# Patient Record
Sex: Female | Born: 1970 | Race: White | Hispanic: No | Marital: Married | Smoking: Never smoker
Health system: Southern US, Community
[De-identification: ages and names within clinical notes are randomized; demographics above are authoritative.]

## PROBLEM LIST (undated history)

## (undated) DIAGNOSIS — Z9889 Other specified postprocedural states: Secondary | ICD-10-CM

## (undated) DIAGNOSIS — R112 Nausea with vomiting, unspecified: Secondary | ICD-10-CM

## (undated) DIAGNOSIS — N63 Unspecified lump in unspecified breast: Secondary | ICD-10-CM

## (undated) DIAGNOSIS — N189 Chronic kidney disease, unspecified: Secondary | ICD-10-CM

## (undated) DIAGNOSIS — C801 Malignant (primary) neoplasm, unspecified: Secondary | ICD-10-CM

## (undated) DIAGNOSIS — F419 Anxiety disorder, unspecified: Secondary | ICD-10-CM

## (undated) DIAGNOSIS — I10 Essential (primary) hypertension: Secondary | ICD-10-CM

## (undated) HISTORY — PX: OTHER SURGICAL HISTORY: SHX169

## (undated) HISTORY — PX: BREAST CYST ASPIRATION: SHX578

## (undated) HISTORY — PX: PLACEMENT OF BREAST IMPLANTS: SHX6334

---

## 1998-07-08 HISTORY — PX: AUGMENTATION MAMMAPLASTY: SUR837

## 2009-09-13 ENCOUNTER — Ambulatory Visit: Payer: Self-pay

## 2011-03-06 ENCOUNTER — Ambulatory Visit: Payer: Self-pay

## 2011-03-08 ENCOUNTER — Ambulatory Visit: Payer: Self-pay

## 2011-03-21 ENCOUNTER — Ambulatory Visit: Payer: Self-pay | Admitting: Internal Medicine

## 2011-04-26 ENCOUNTER — Ambulatory Visit: Payer: Self-pay | Admitting: Urology

## 2014-03-21 ENCOUNTER — Ambulatory Visit: Payer: Self-pay | Admitting: Physician Assistant

## 2014-10-05 ENCOUNTER — Ambulatory Visit
Admit: 2014-10-05 | Disposition: A | Discharge status: Home / Self Care | Payer: Self-pay | Attending: Podiatry | Admitting: Podiatry

## 2014-10-27 ENCOUNTER — Ambulatory Visit
Admit: 2014-10-27 | Disposition: A | Discharge status: Home / Self Care | Payer: Self-pay | Attending: Physician Assistant | Admitting: Physician Assistant

## 2014-10-31 LAB — SURGICAL PATHOLOGY

## 2014-11-03 ENCOUNTER — Ambulatory Visit
Admit: 2014-11-03 | Disposition: A | Discharge status: Home / Self Care | Payer: Self-pay | Attending: Physician Assistant | Admitting: Physician Assistant

## 2014-11-03 ENCOUNTER — Ambulatory Visit
Admit: 2014-11-03 | Disposition: A | Discharge status: Home / Self Care | Payer: Self-pay | Attending: Ophthalmology | Admitting: Ophthalmology

## 2014-11-07 ENCOUNTER — Inpatient Hospital Stay: Admission: RE | Admit: 2014-11-07 | Payer: Self-pay | Source: Ambulatory Visit

## 2014-11-07 ENCOUNTER — Encounter: Payer: Self-pay | Admitting: *Deleted

## 2014-11-07 DIAGNOSIS — Z882 Allergy status to sulfonamides status: Secondary | ICD-10-CM | POA: Diagnosis not present

## 2014-11-07 DIAGNOSIS — Z978 Presence of other specified devices: Secondary | ICD-10-CM | POA: Diagnosis not present

## 2014-11-07 DIAGNOSIS — Z87898 Personal history of other specified conditions: Secondary | ICD-10-CM | POA: Diagnosis not present

## 2014-11-07 DIAGNOSIS — Z85828 Personal history of other malignant neoplasm of skin: Secondary | ICD-10-CM | POA: Diagnosis not present

## 2014-11-07 DIAGNOSIS — F419 Anxiety disorder, unspecified: Secondary | ICD-10-CM | POA: Diagnosis not present

## 2014-11-07 DIAGNOSIS — I10 Essential (primary) hypertension: Secondary | ICD-10-CM | POA: Diagnosis not present

## 2014-11-07 DIAGNOSIS — Z87442 Personal history of urinary calculi: Secondary | ICD-10-CM | POA: Diagnosis not present

## 2014-11-07 DIAGNOSIS — H2512 Age-related nuclear cataract, left eye: Secondary | ICD-10-CM | POA: Diagnosis not present

## 2014-11-13 NOTE — H&P (Signed)
  History and physical was faxed and scanned in.   

## 2014-11-14 ENCOUNTER — Encounter: Payer: Self-pay | Admitting: Anesthesiology

## 2014-11-14 ENCOUNTER — Encounter
Admission: RE | Disposition: A | Discharge status: Home / Self Care | Payer: Self-pay | Source: Ambulatory Visit | Attending: Ophthalmology

## 2014-11-14 ENCOUNTER — Ambulatory Visit: Payer: BLUE CROSS/BLUE SHIELD | Admitting: Anesthesiology

## 2014-11-14 ENCOUNTER — Ambulatory Visit
Admission: RE | Admit: 2014-11-14 | Discharge: 2014-11-14 | Disposition: A | Discharge status: Home / Self Care | Payer: BLUE CROSS/BLUE SHIELD | Source: Ambulatory Visit | Attending: Ophthalmology | Admitting: Ophthalmology

## 2014-11-14 DIAGNOSIS — Z87442 Personal history of urinary calculi: Secondary | ICD-10-CM | POA: Insufficient documentation

## 2014-11-14 DIAGNOSIS — Z978 Presence of other specified devices: Secondary | ICD-10-CM | POA: Insufficient documentation

## 2014-11-14 DIAGNOSIS — H2512 Age-related nuclear cataract, left eye: Secondary | ICD-10-CM | POA: Diagnosis not present

## 2014-11-14 DIAGNOSIS — Z882 Allergy status to sulfonamides status: Secondary | ICD-10-CM | POA: Insufficient documentation

## 2014-11-14 DIAGNOSIS — I10 Essential (primary) hypertension: Secondary | ICD-10-CM | POA: Insufficient documentation

## 2014-11-14 DIAGNOSIS — F419 Anxiety disorder, unspecified: Secondary | ICD-10-CM | POA: Insufficient documentation

## 2014-11-14 DIAGNOSIS — Z87898 Personal history of other specified conditions: Secondary | ICD-10-CM | POA: Insufficient documentation

## 2014-11-14 DIAGNOSIS — Z85828 Personal history of other malignant neoplasm of skin: Secondary | ICD-10-CM | POA: Insufficient documentation

## 2014-11-14 HISTORY — DX: Nausea with vomiting, unspecified: R11.2

## 2014-11-14 HISTORY — DX: Anxiety disorder, unspecified: F41.9

## 2014-11-14 HISTORY — DX: Chronic kidney disease, unspecified: N18.9

## 2014-11-14 HISTORY — PX: CATARACT EXTRACTION W/PHACO: SHX586

## 2014-11-14 HISTORY — DX: Malignant (primary) neoplasm, unspecified: C80.1

## 2014-11-14 HISTORY — DX: Nausea with vomiting, unspecified: Z98.890

## 2014-11-14 HISTORY — DX: Essential (primary) hypertension: I10

## 2014-11-14 LAB — POCT PREGNANCY, URINE: Preg Test, Ur: NEGATIVE

## 2014-11-14 SURGERY — PHACOEMULSIFICATION, CATARACT, WITH IOL INSERTION
Anesthesia: Monitor Anesthesia Care | Site: Eye | Laterality: Left | Wound class: Clean

## 2014-11-14 MED ORDER — ALFENTANIL 500 MCG/ML IJ INJ
INJECTION | INTRAMUSCULAR | Status: DC | PRN
  Administered 2014-11-14: 500 ug via INTRAVENOUS

## 2014-11-14 MED ORDER — FENTANYL CITRATE (PF) 100 MCG/2ML IJ SOLN
INTRAMUSCULAR | Status: DC | PRN
  Administered 2014-11-14: 100 ug via INTRAVENOUS

## 2014-11-14 MED ORDER — MOXIFLOXACIN HCL 0.5 % OP SOLN - NO CHARGE
OPHTHALMIC | Status: DC | PRN
  Administered 2014-11-14: 1 [drp] via OPHTHALMIC

## 2014-11-14 MED ORDER — LIDOCAINE HCL (PF) 4 % IJ SOLN
INTRAMUSCULAR | Status: AC
  Filled 2014-11-14: qty 5

## 2014-11-14 MED ORDER — NA CHONDROIT SULF-NA HYALURON 40-17 MG/ML IO SOLN
INTRAOCULAR | Status: AC
  Filled 2014-11-14: qty 1

## 2014-11-14 MED ORDER — TETRACAINE HCL 0.5 % OP SOLN
OPHTHALMIC | Status: AC
  Filled 2014-11-14: qty 2

## 2014-11-14 MED ORDER — MOXIFLOXACIN HCL 0.5 % OP SOLN
1.0000 [drp] | OPHTHALMIC | Status: AC
  Administered 2014-11-14 (×3): 1 [drp] via OPHTHALMIC

## 2014-11-14 MED ORDER — PROPARACAINE HCL 0.5 % OP SOLN
OPHTHALMIC | Status: AC
  Filled 2014-11-14: qty 15

## 2014-11-14 MED ORDER — TETRACAINE HCL 0.5 % OP SOLN
OPHTHALMIC | Status: DC | PRN
  Administered 2014-11-14: 1 [drp] via OPHTHALMIC

## 2014-11-14 MED ORDER — SODIUM CHLORIDE 0.9 % IV SOLN
INTRAVENOUS | Status: DC
  Administered 2014-11-14 (×2): via INTRAVENOUS

## 2014-11-14 MED ORDER — CEFUROXIME OPHTHALMIC INJECTION 1 MG/0.1 ML
INJECTION | OPHTHALMIC | Status: AC
  Filled 2014-11-14: qty 0.1

## 2014-11-14 MED ORDER — EPINEPHRINE HCL 1 MG/ML IJ SOLN
INTRAMUSCULAR | Status: AC
  Filled 2014-11-14: qty 1

## 2014-11-14 MED ORDER — HYALURONIDASE HUMAN 150 UNIT/ML IJ SOLN
INTRAMUSCULAR | Status: AC
  Filled 2014-11-14: qty 1

## 2014-11-14 MED ORDER — CYCLOPENTOLATE HCL 2 % OP SOLN
1.0000 [drp] | OPHTHALMIC | Status: AC
  Administered 2014-11-14 (×4): 1 [drp] via OPHTHALMIC

## 2014-11-14 MED ORDER — NA CHONDROIT SULF-NA HYALURON 40-17 MG/ML IO SOLN
INTRAOCULAR | Status: DC | PRN
  Administered 2014-11-14: 1 mL via INTRAOCULAR

## 2014-11-14 MED ORDER — MIDAZOLAM HCL 2 MG/2ML IJ SOLN
INTRAMUSCULAR | Status: DC | PRN
  Administered 2014-11-14: 1.5 mg via INTRAVENOUS
  Administered 2014-11-14: 1 mg via INTRAVENOUS

## 2014-11-14 MED ORDER — EPINEPHRINE HCL 1 MG/ML IJ SOLN
INTRAMUSCULAR | Status: DC | PRN
  Administered 2014-11-14: 1 mL

## 2014-11-14 MED ORDER — CYCLOPENTOLATE HCL 2 % OP SOLN
OPHTHALMIC | Status: AC
  Filled 2014-11-14: qty 2

## 2014-11-14 MED ORDER — CARBACHOL 0.01 % IO SOLN
INTRAOCULAR | Status: DC | PRN
  Administered 2014-11-14: 0.5 mL via INTRAOCULAR

## 2014-11-14 MED ORDER — PROPARACAINE HCL 0.5 % OP SOLN
OPHTHALMIC | Status: DC | PRN
  Administered 2014-11-14: 1 [drp] via OPHTHALMIC

## 2014-11-14 MED ORDER — BUPIVACAINE HCL (PF) 0.75 % IJ SOLN
INTRAMUSCULAR | Status: AC
  Filled 2014-11-14: qty 10

## 2014-11-14 MED ORDER — MOXIFLOXACIN HCL 0.5 % OP SOLN
OPHTHALMIC | Status: AC
  Filled 2014-11-14: qty 3

## 2014-11-14 MED ORDER — PHENYLEPHRINE HCL 10 % OP SOLN
1.0000 [drp] | OPHTHALMIC | Status: AC
  Administered 2014-11-14 (×4): 1 [drp] via OPHTHALMIC

## 2014-11-14 MED ORDER — PHENYLEPHRINE HCL 10 % OP SOLN
OPHTHALMIC | Status: AC
  Filled 2014-11-14: qty 5

## 2014-11-14 SURGICAL SUPPLY — 27 items
CENTURION VISION SYSTEM ×3 IMPLANT
CORD BIP STRL DISP 12FT (MISCELLANEOUS) ×3 IMPLANT
DRAPE XRAY CASSETTE 23X24 (DRAPES) ×3 IMPLANT
ERASER HMR WETFIELD 18G (MISCELLANEOUS) ×3 IMPLANT
GLOVE BIO SURGEON STRL SZ8 (GLOVE) ×3 IMPLANT
GLOVE SURG LX 6.5 MICRO (GLOVE) ×2
GLOVE SURG LX 8.0 MICRO (GLOVE) ×2
GLOVE SURG LX STRL 6.5 MICRO (GLOVE) ×1 IMPLANT
GLOVE SURG LX STRL 8.0 MICRO (GLOVE) ×1 IMPLANT
GOWN STRL REUS W/ TWL LRG LVL3 (GOWN DISPOSABLE) ×1 IMPLANT
GOWN STRL REUS W/ TWL XL LVL3 (GOWN DISPOSABLE) ×1 IMPLANT
GOWN STRL REUS W/TWL LRG LVL3 (GOWN DISPOSABLE) ×2
GOWN STRL REUS W/TWL XL LVL3 (GOWN DISPOSABLE) ×2
LENS IOL TECNIS TRC I 300 22.0 (Intraocular Lens) ×1 IMPLANT
LENS IOL TORIC 22.0 (Intraocular Lens) ×2 IMPLANT
LENS IOL TORIC 300 22.0 (Intraocular Lens) ×1 IMPLANT
PACK CATARACT (MISCELLANEOUS) ×3 IMPLANT
PACK CATARACT DINGLEDEIN LX (MISCELLANEOUS) ×3 IMPLANT
PACK EYE AFTER SURG (MISCELLANEOUS) ×3 IMPLANT
SHLD EYE VISITEC  UNIV (MISCELLANEOUS) ×3 IMPLANT
SOL PREP PVP 2OZ (MISCELLANEOUS) ×3
SOLUTION PREP PVP 2OZ (MISCELLANEOUS) ×1 IMPLANT
SUT SILK 5-0 (SUTURE) ×3 IMPLANT
SYR 5ML LL (SYRINGE) ×3 IMPLANT
SYR TB 1ML 27GX1/2 LL (SYRINGE) ×3 IMPLANT
WATER STERILE IRR 1000ML POUR (IV SOLUTION) ×3 IMPLANT
WIPE NON LINTING 3.25X3.25 (MISCELLANEOUS) ×3 IMPLANT

## 2014-11-14 NOTE — Discharge Instructions (Addendum)
Eye Surgery Discharge Instructions  Expect mild scratchy sensation or mild soreness. DO NOT RUB YOUR EYE!  The day of surgery:  Minimal physical activity, but bed rest is not required  No reading, computer work, or close hand work  No bending, lifting, or straining.  May watch TV  For 24 hours:  No driving, legal decisions, or alcoholic beverages  Safety precautions  Eat anything you prefer: It is better to start with liquids, then soup then solid foods.  _____ Eye patch should be worn until postoperative exam tomorrow.  ____ Solar shield eyeglasses should be worn for comfort in the sunlight/patch while sleeping  Resume all regular medications including aspirin or Coumadin if these were discontinued prior to surgery. You may shower, bathe, shave, or wash your hair. Tylenol may be taken for mild discomfort.  Call your doctor if you experience significant pain, nausea, or vomiting, fever > 101 or other signs of infection. [phone] or [phone] Specific instructions:  Follow-up Information    Follow up with DINGELDEIN,STEVEN, MD In 1 day.   Specialty:  Ophthalmology   Contact information:   [address]   [place] [address] [phone]         See handout  Appointment 11/15/14  At 10:10Cataract Surgery  A cataract is a clouding of the lens of the eye. When a lens becomes cloudy, vision is reduced based on the degree and nature of the clouding. Surgery may be needed to improve vision. Surgery removes the cloudy lens and usually replaces it with a substitute lens (intraocular lens, IOL). LET YOUR EYE DOCTOR KNOW ABOUT:  Allergies to food or medicine.  Medicines taken including herbs, eye drops, over-the-counter medicines, and creams.  Use of steroids (by mouth or creams).  Previous problems with anesthetics or numbing medicine.  History of bleeding problems or blood clots.  Previous surgery.  Other health problems, including diabetes and  kidney problems.  Possibility of pregnancy, if this applies. RISKS AND COMPLICATIONS  Infection.  Inflammation of the eyeball (endophthalmitis) that can spread to both eyes (sympathetic ophthalmia).  Poor wound healing.  If an IOL is inserted, it can later fall out of proper position. This is very uncommon.  Clouding of the part of your eye that holds an IOL in place. This is called an "after-cataract." These are uncommon but easily treated. BEFORE THE PROCEDURE  Do not eat or drink anything except small amounts of water for 8 to 12 before your surgery, or as directed by your caregiver.  Unless you are told otherwise, continue any eye drops you have been prescribed.  Talk to your primary caregiver about all other medicines that you take (both prescription and nonprescription). In some cases, you may need to stop or change medicines near the time of your surgery. This is most important if you are taking blood-thinning medicine.Do not stop medicines unless you are told to do so.  Arrange for someone to drive you to and from the procedure.  Do not put contact lenses in either eye on the day of your surgery. PROCEDURE There is more than one method for safely removing a cataract. Your doctor can explain the differences and help determine which is best for you. Phacoemulsification surgery is the most common form of cataract surgery.  An injection is given behind the eye or eye drops are given to make this a painless procedure.  A small cut (incision) is made on the edge of the clear, dome-shaped surface that covers the front of  the eye (cornea).  A tiny probe is painlessly inserted into the eye. This device gives off ultrasound waves that soften and break up the cloudy center of the lens. This makes it easier for the cloudy lens to be removed by suction.  An IOL may be implanted.  The normal lens of the eye is covered by a clear capsule. Part of that capsule is intentionally left in  the eye to support the IOL.  Your surgeon may or may not use stitches to close the incision. There are other forms of cataract surgery that require a larger incision and stitches to close the eye. This approach is taken in cases where the doctor feels that the cataract cannot be easily removed using phacoemulsification. AFTER THE PROCEDURE  When an IOL is implanted, it does not need care. It becomes a permanent part of your eye and cannot be seen or felt.  Your doctor will schedule follow-up exams to check on your progress.  Review your other medicines with your doctor to see which can be resumed after surgery.  Use eye drops or take medicine as prescribed by your doctor. Document Released: 06/13/2011 Document Revised: 11/08/2013 Document Reviewed: 06/13/2011 [facility] Patient Information 2015 [place], [place]. This information is not intended to replace advice given to you by your health care provider. Make sure you discuss any questions you have with your health care provider.

## 2014-11-14 NOTE — Discharge Summary (Signed)
Patient discharged in the care of a responsible adult. Volunteer wheeled patient to front door and assisted placing her in the car.

## 2014-11-14 NOTE — Interval H&P Note (Signed)
History and Physical Interval Note:  11/14/2014 8:24 AM  Tanya Powell  has presented today for surgery, with the diagnosis of cataract  The various methods of treatment have been discussed with the patient and family. After consideration of risks, benefits and other options for treatment, the patient has consented to  Procedure(s): CATARACT EXTRACTION PHACO AND INTRAOCULAR LENS PLACEMENT ([place]) (Left) as a surgical intervention .  The patient's history has been reviewed, patient examined, no change in status, stable for surgery.  I have reviewed the patient's chart and labs.  Questions were answered to the patient's satisfaction.     [name]

## 2014-11-14 NOTE — Anesthesia Postprocedure Evaluation (Signed)
  Anesthesia Post-op Note  Patient: Tanya Powell  Procedure(s) Performed: Procedure(s) with comments: CATARACT EXTRACTION PHACO AND INTRAOCULAR LENS PLACEMENT ([facility]) (Left) - [place] 00:35 AP% 18.3 CDE 8.72  Anesthesia type:MAC  Patient location: PACU  Post pain: Pain level controlled  Post assessment: Post-op Vital signs reviewed, Patient's Cardiovascular Status Stable, Respiratory Function Stable, Patent Airway and No signs of Nausea or vomiting  Post vital signs: Reviewed and stable  Last Vitals:  Filed Vitals:   11/14/14 0806  BP: 122/83  Pulse: 84  Temp: 36.7 C  Resp: 16    Level of consciousness: awake, alert  and patient cooperative  Complications: No apparent anesthesia complications

## 2014-11-14 NOTE — Interval H&P Note (Signed)
History and Physical Interval Note:  11/14/2014 8:25 AM  Tanya Powell  has presented today for surgery, with the diagnosis of cataract  The various methods of treatment have been discussed with the patient and family. After consideration of risks, benefits and other options for treatment, the patient has consented to  Procedure(s): CATARACT EXTRACTION PHACO AND INTRAOCULAR LENS PLACEMENT ([place]) (Left) as a surgical intervention .  The patient's history has been reviewed, patient examined, no change in status, stable for surgery.  I have reviewed the patient's chart and labs.  Questions were answered to the patient's satisfaction.     Tanya Powell

## 2014-11-14 NOTE — Brief Op Note (Signed)
11/14/2014  10:11 AM  PATIENT:  Tanya Powell  44 y.o. female  PRE-OPERATIVE DIAGNOSIS:  Cataract left eye  POST-OPERATIVE DIAGNOSIS:  Cataract eye  PROCEDURE:  Procedure(s) with comments: CATARACT EXTRACTION PHACO AND INTRAOCULAR LENS PLACEMENT ([facility]) (Left) - [place] 00:35 AP% 18.3 CDE 8.72  SURGEON:  Surgeon(s) and Role:    Remo Lipps Nadia Torr, MD - Primary  PHYSICIAN ASSISTANT:   ASSISTANTS: none   ANESTHESIA: 4% lidocaine with 0.75% Marcaine 50/50 mixture with 10 units per cc of Hylenex given as peribulbar  EBL:     BLOOD ADMINISTERED:none  DRAINS: none   LOCAL MEDICATIONS USED:  MARCAINE    and LIDOCAINE   SPECIMEN:  No Specimen  DISPOSITION OF SPECIMEN:  N/A  COUNTS:  YES  TOURNIQUET:  * No tourniquets in log *  DICTATION: .Note written in EPIC  PLAN OF CARE: Discharge to home after PACU  PATIENT DISPOSITION:  Short Stay   Delay start of Pharmacological VTE agent (>24hrs) due to surgical blood loss or risk of bleeding: not applicable

## 2014-11-14 NOTE — Transfer of Care (Signed)
Immediate Anesthesia Transfer of Care Note  Patient: Tanya Powell  Procedure(s) Performed: Procedure(s) with comments: CATARACT EXTRACTION PHACO AND INTRAOCULAR LENS PLACEMENT ([facility]) (Left) - [place] 00:35 AP% 18.3 CDE 8.72  Patient Location: PACU  Anesthesia Type:MAC  Level of Consciousness: awake, alert  and oriented  Airway & Oxygen Therapy: Patient Spontanous Breathing  Post-op Assessment: Report given to RN and Post -op Vital signs reviewed and stable  Post vital signs: Reviewed and stable  Last Vitals:  Filed Vitals:   11/14/14 0806  BP: 122/83  Pulse: 84  Temp: 36.7 C  Resp: 16    Complications: No apparent anesthesia complications

## 2014-11-14 NOTE — Interval H&P Note (Signed)
History and Physical Interval Note:  11/14/2014 8:25 AM  Tanya Powell  has presented today for surgery, with the diagnosis of cataract  The various methods of treatment have been discussed with the patient and family. After consideration of risks, benefits and other options for treatment, the patient has consented to  Procedure(s): CATARACT EXTRACTION PHACO AND INTRAOCULAR LENS PLACEMENT ([place]) (Left) as a surgical intervention .  The patient's history has been reviewed, patient examined, no change in status, stable for surgery.  I have reviewed the patient's chart and labs.  Questions were answered to the patient's satisfaction.     Misako Roeder

## 2014-11-14 NOTE — Op Note (Signed)
Date of Surgery: 11/14/2014 Date of Dictation: 11/14/2014 Pre-operative Diagnosis: Nuclear Sclerotic Cataract Left Eye Post-operative Diagnosis: same Procedure performed: Extra-capsular Cataract Extraction (ECCE) with placement of a toric posterior chamber intraocular lens (IOL) Left Eye IOL Alcon AcrySof IQ Toric LensT4 22.0 with 2.25D Cyl Anesthesia: 2% Lidocaine and 4% Marcaine in a 50/50 mixture with 10 unites/ml of Hylenex given as a peribulbar Anesthesiologist: Dr. Marcello Moores Complications: none Estimated Blood Loss: less than 1 ml  Description of procedure:  The patient sat upright and the eyes were anesthetized with topical proparacaine. With the patient fixing at a distant target the 3 o'clock and 9 o'clock positions at the limbus were marked with an sterile indelible surgical marking pen.  The patient was given anesthesia and sedation via intravenous access. The patient was then prepped and draped in the usual fashion. A 25-gauge needle was bent to use for starting the capsulorhexis. A 5-0 silk suture was placed through the conjunctiva superior and inferiorly to serve as bridle sutures.   The [place] system was engaged and registration was performed. The positions of the incisions and the steep axis of the astigmatism for lens alignment (3 degrees) were identified by the [place] system and marked with an indelible pen. The Verion heads up display was turned off.  Hemostasis was obtained at the the position of the main incision using an eraser cautery. A partial thickness groove was made at the at that location with a 64 [place] blade and this was dissected anteriorly with an Avaya. The anterior chamber was entered at 10 o'clock with a 1.0 mm paracentesis knife and through the lamellar dissection with a 2.6 mm Alcon keratome. DiscoVisc was injected to replace the aqueous and a continuous tear curvilinear capsulorhexis was performed using a bent 25-gauge needle.  Balance salt on a  syringe was used to perform hydro-dissection and phacoemulsification was carried out using a divide and conquer technique. Total ultrasound time was 00:35. The average ultrasonic power was 18.3. The CDE was 8.72.  Irrigation/aspiration was used to remove the residual cortex and the capsular bag was inflated with DiscoVisc. The intraocular lens was inserted into the capsular bag using a Monarch Delivery System cartridge. The Verion heads up display was turned on and the lens was rotated so that the marks on the base of the haptics were aligned with the steep axis of astigmatism as identified using the [place] unit.   Irrigation/aspiration was used to remove the residual DiscoVisc. The I/A hand piece was pressed down on top of the lens to prevent rotation. The wound was inflated with balanced salt and checked for leaks. None were found. The position of the Toric lens was reconfirmed using the [place] unit. Miostat was injected via the paracentesis track and 0.1 ml of cefuroxime containing 1 mg of drug was injected via the paracentesis track. The wound was checked for leaks again and none were found.   The bridal sutures were removed and two drops of Vigamox were placed on the eye. An eye shield was placed to protect the eye and the patient was discharged to the recovery area in good condition.   Rosalinda Seaman MD @T @

## 2014-11-14 NOTE — Anesthesia Preprocedure Evaluation (Addendum)
Anesthesia Evaluation   Patient awake    Reviewed: Allergy & Precautions, NPO status , Patient's Chart, lab work & pertinent test results, reviewed documented beta blocker date and time   History of Anesthesia Complications (+) PONV  Airway Mallampati: II  TM Distance: >3 FB Neck ROM: Full    Dental  (+) Chipped   Pulmonary          Cardiovascular hypertension,     Neuro/Psych Anxiety    GI/Hepatic   Endo/Other    Renal/GU Renal disease     Musculoskeletal   Abdominal   Peds  Hematology   Anesthesia Other Findings   Reproductive/Obstetrics                            Anesthesia Physical Anesthesia Plan  ASA: III  Anesthesia Plan: MAC   Post-op Pain Management:    Induction:   Airway Management Planned: Nasal Cannula  Additional Equipment:   Intra-op Plan:   Post-operative Plan:   Informed Consent: I have reviewed the patients History and Physical, chart, labs and discussed the procedure including the risks, benefits and alternatives for the proposed anesthesia with the patient or authorized representative who has indicated his/her understanding and acceptance.     Plan Discussed with: CRNA  Anesthesia Plan Comments:         Anesthesia Quick Evaluation

## 2014-11-17 ENCOUNTER — Encounter: Payer: Self-pay | Admitting: Ophthalmology

## 2016-06-25 IMAGING — MG MM DIGITAL SCREENING BILAT W/ CAD
9 series · 9 of 9 positions shown · non-contrast
Comparison: Previous exam(s).

CLINICAL DATA: Screening.

EXAM:
DIGITAL SCREENING BILATERAL MAMMOGRAM WITH CAD with implants.
The patient has bilateral submuscular saline implants.

[R MLO (1 of 3)]
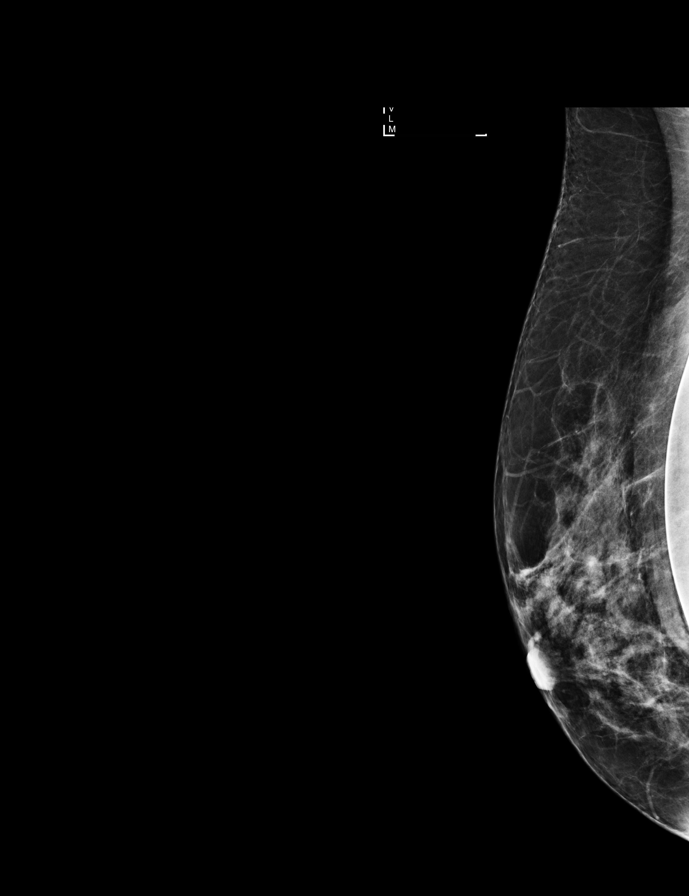

[L CC (1 of 2)]
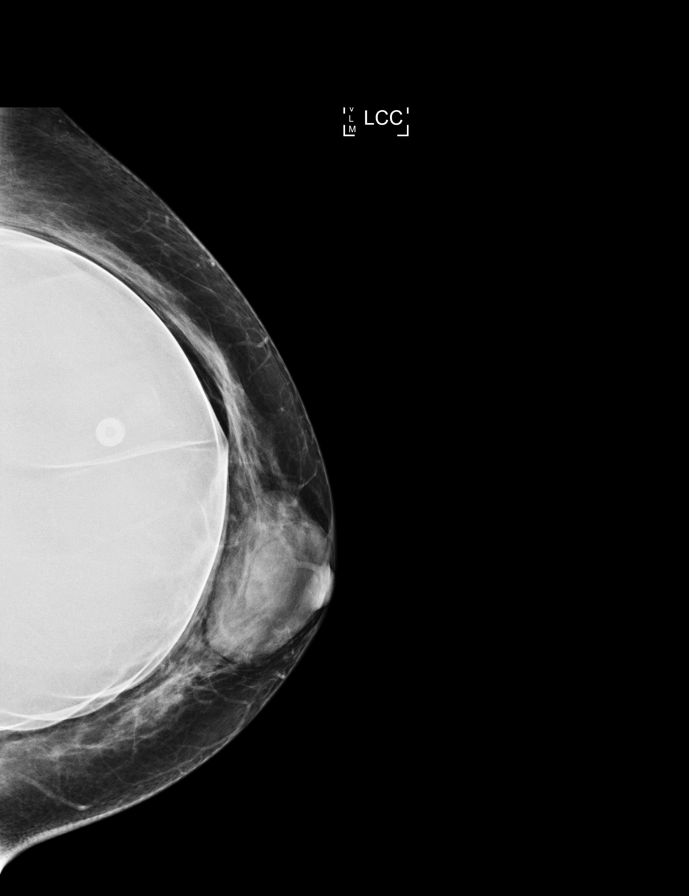

[R MLO (2 of 3)]
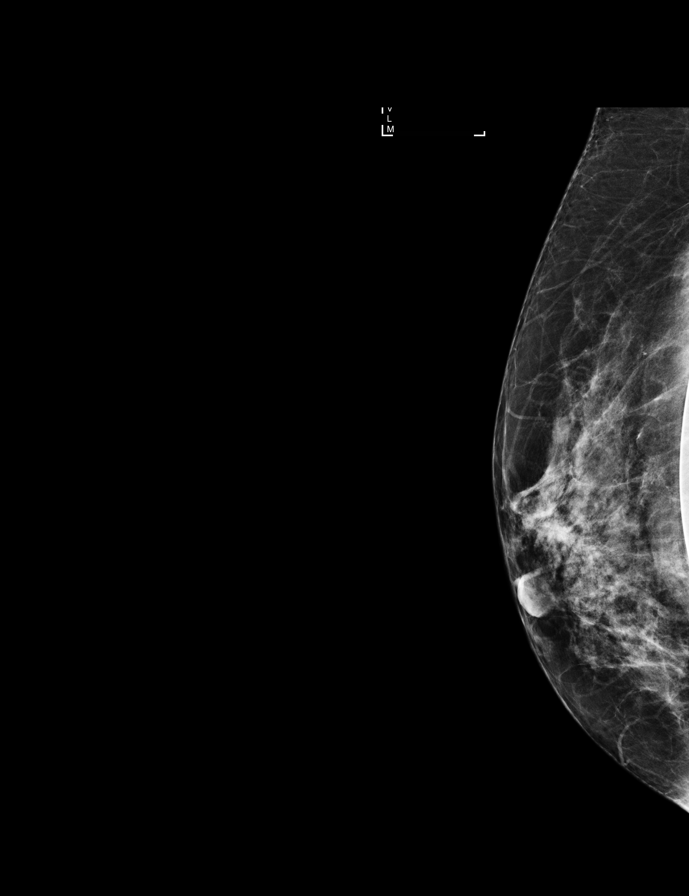

[L CC (2 of 2)]
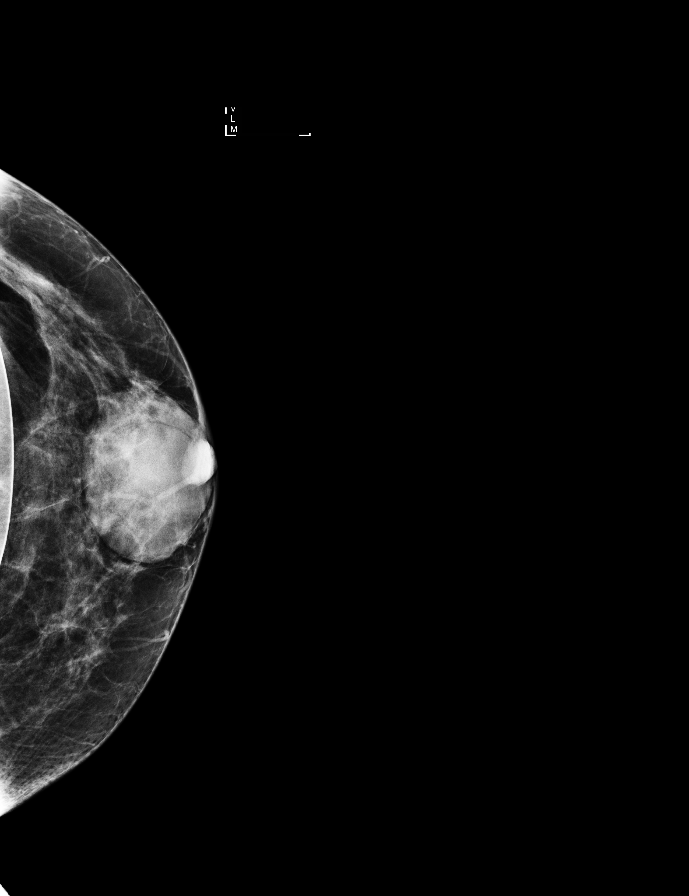

[R CC (1 of 2)]
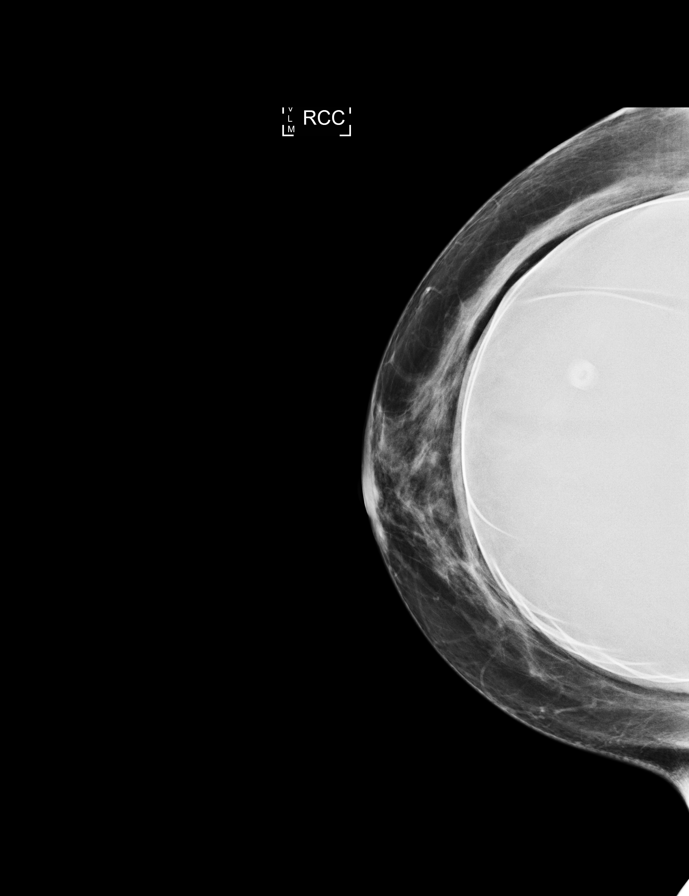

[L MLO (1 of 2)]
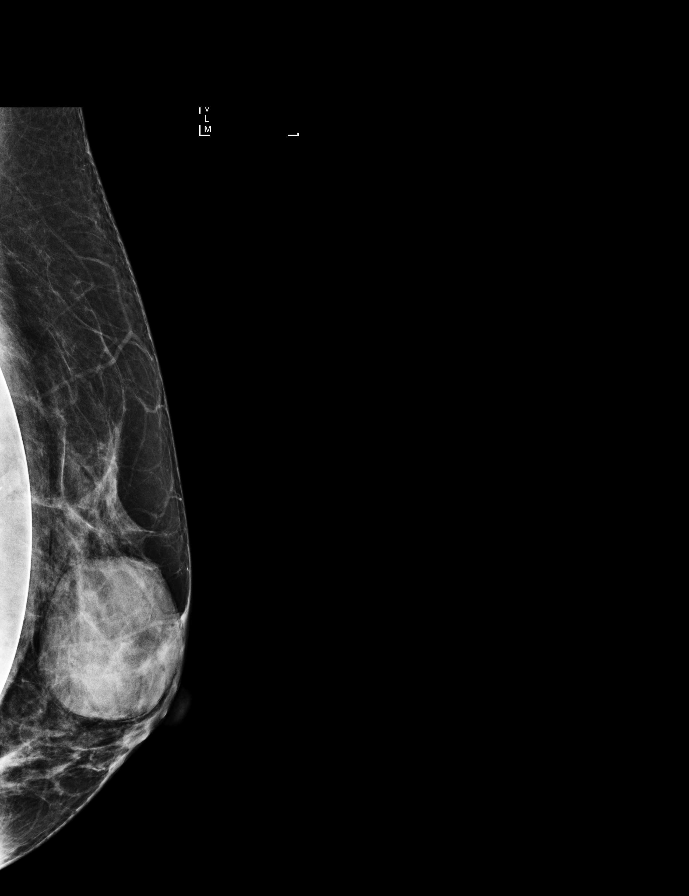

[R MLO (3 of 3)]
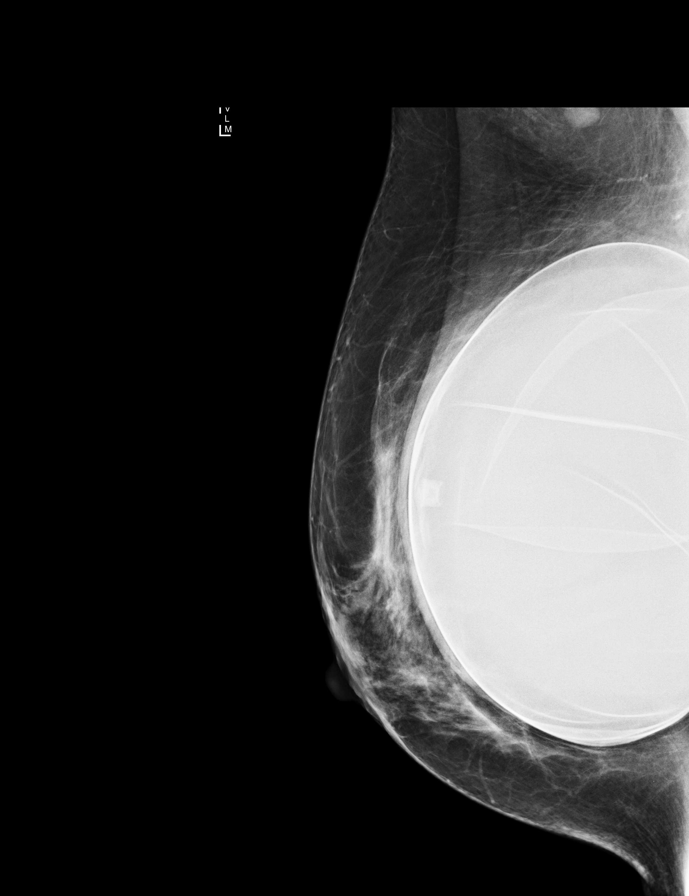

[R CC (2 of 2)]
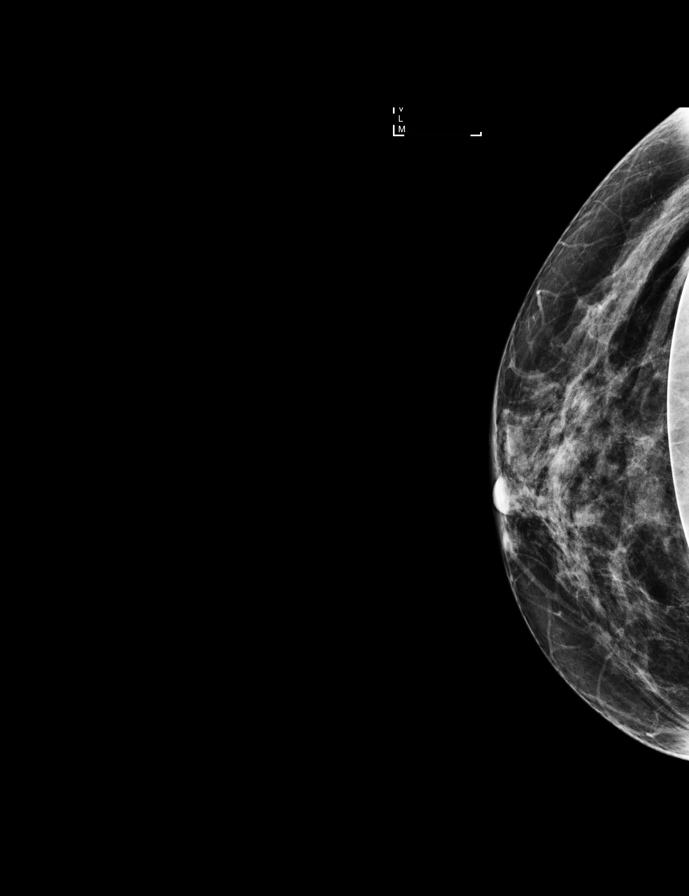

[L MLO (2 of 2)]
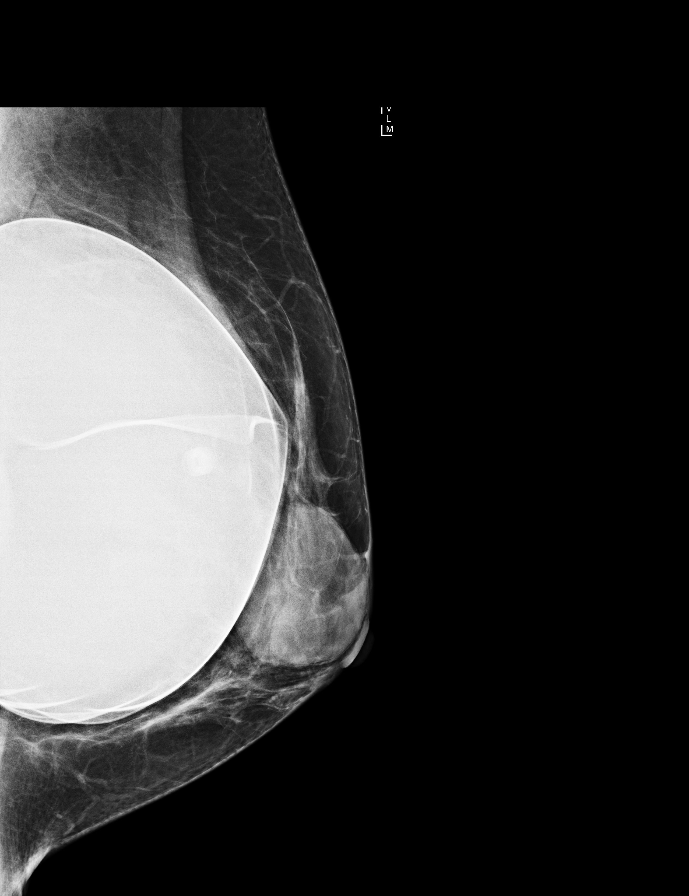

[9 of 9 positions shown; findings below may reference images not displayed]

ACR Breast Density Category d: The breast tissue is extremely dense,
which lowers the sensitivity of mammography.
FINDINGS: In the left breast, a possible mass warrants further evaluation. In
the right breast, no findings suspicious for malignancy. Images were
processed with CAD.
IMPRESSION: Further evaluation is suggested for possible mass in the left
breast.

RECOMMENDATION:
Ultrasound of the left breast. (Code:[id number])

The patient will be contacted regarding the findings, and additional
imaging will be scheduled.

BI-RADS CATEGORY  0: Incomplete. Need additional imaging evaluation
and/or prior mammograms for comparison.

## 2016-07-02 IMAGING — US US BREAST*L* LIMITED INC AXILLA
1 series · 5 of 5 positions shown · non-contrast
Comparison: Previous exam(s).

CLINICAL DATA: Patient presents for ultrasound of a retroareolar
left breast mass noted on the current screening study.

EXAM:
ULTRASOUND OF THE LEFT BREAST

[Series 1: us breast*left* limited inc axilla · 0.08mm/px · 5 of 5 slices shown]
[im 1/5]
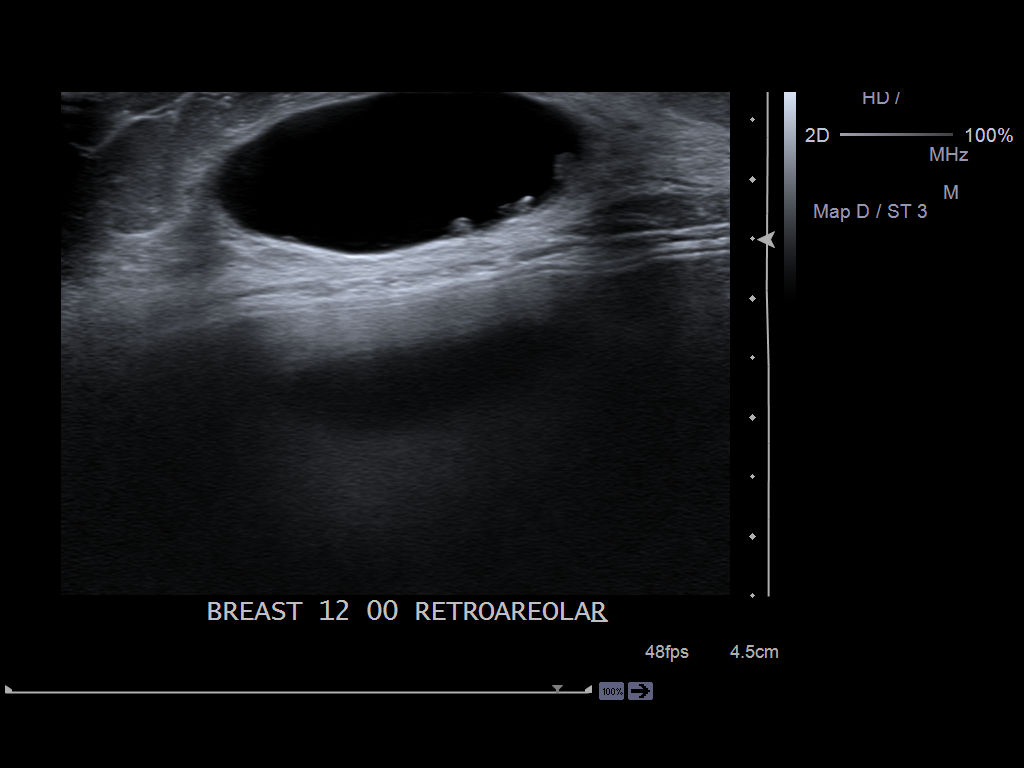
[im 2/5]
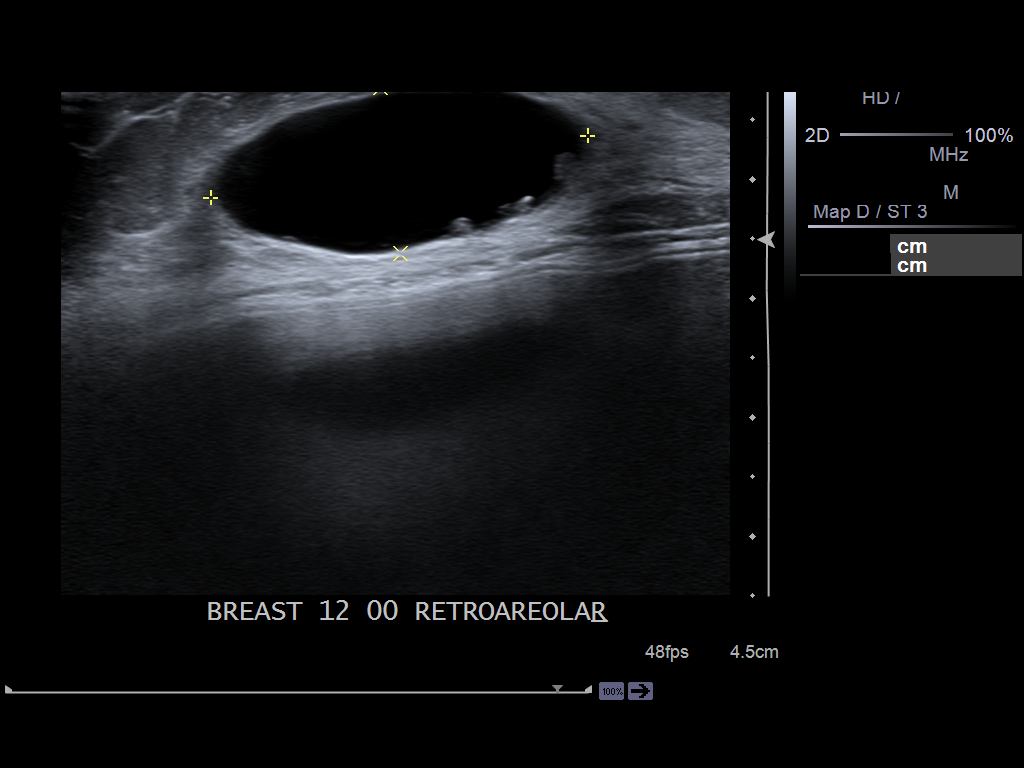
[im 3/5]
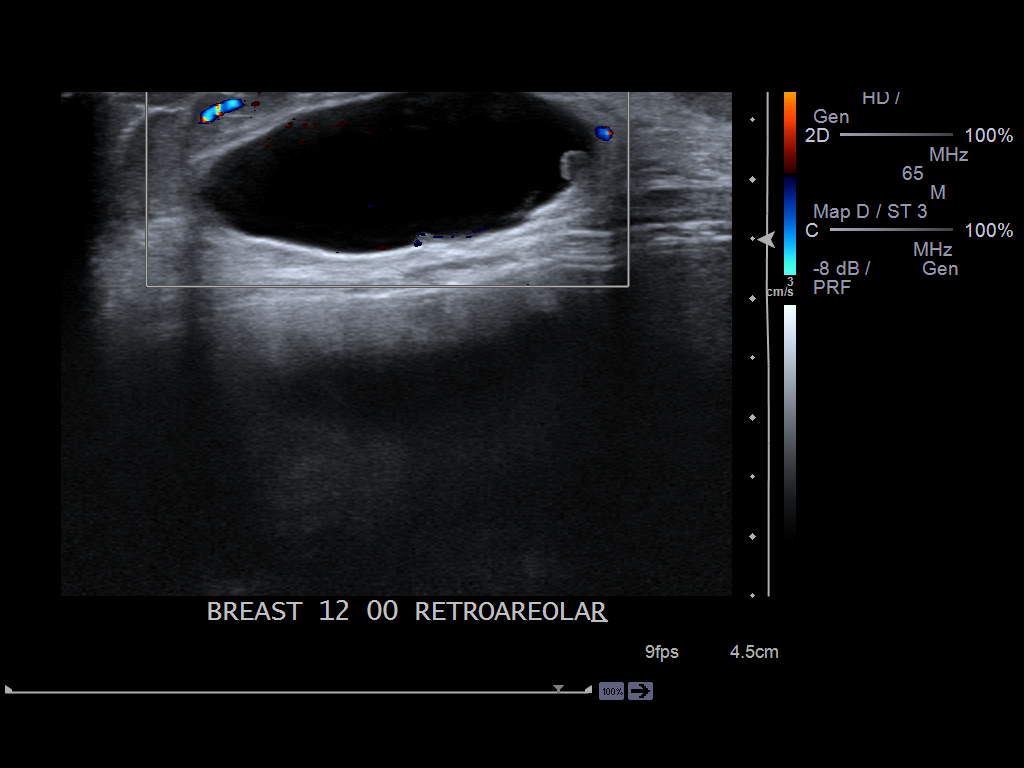
[im 4/5]
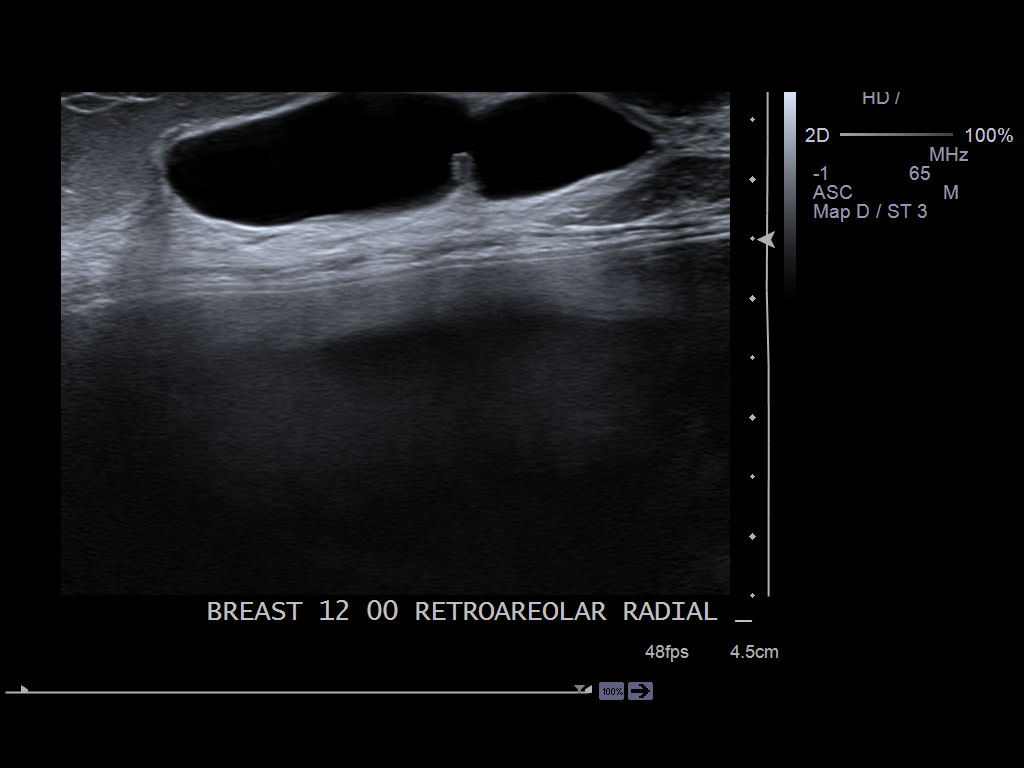
[im 5/5]
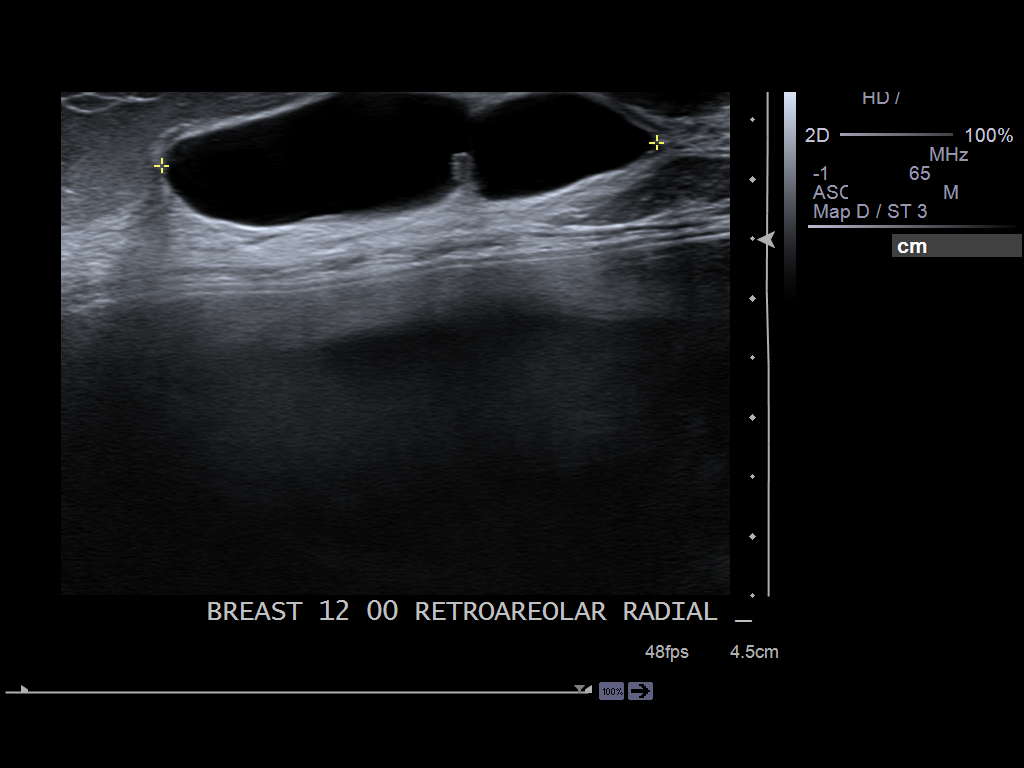

[5 of 5 positions shown; findings below may reference images not displayed]

FINDINGS: On physical exam, there is a ballotable smooth mass in the
retroareolar left breast.

Targeted ultrasound is performed, showing oval cyst measuring 3.2 cm
x 1.4 cm by 4.2 cm. A small all with areas intermediate echogenicity
along the dependent wall infant with small foci of dependent
internal debris. No blood flow is detected within these on color
Doppler analysis. No other complicating feature. Cyst lies in the
retroareolar region of the left breast slightly towards 12 o'clock
position.
IMPRESSION: Benign left breast cyst.

RECOMMENDATION:
Screening mammogram in one year.(Code:IR-5-4RT).

Given the size of this cyst, which limits mammographic visualization
of this location of the left breast, cyst aspiration will be
performed.

I have discussed the findings and recommendations with the patient.
Results were also provided in writing at the conclusion of the
visit. If applicable, a reminder letter will be sent to the patient
regarding the next appointment.

BI-RADS CATEGORY  2: Benign Finding(s)

## 2016-07-22 ENCOUNTER — Other Ambulatory Visit: Payer: Self-pay | Admitting: Physician Assistant

## 2016-07-22 DIAGNOSIS — Z1231 Encounter for screening mammogram for malignant neoplasm of breast: Secondary | ICD-10-CM

## 2016-08-19 ENCOUNTER — Ambulatory Visit
Admission: RE | Admit: 2016-08-19 | Discharge: 2016-08-19 | Disposition: A | Discharge status: Home / Self Care | Payer: 59 | Source: Ambulatory Visit | Attending: Physician Assistant | Admitting: Physician Assistant

## 2016-08-19 DIAGNOSIS — Z1231 Encounter for screening mammogram for malignant neoplasm of breast: Secondary | ICD-10-CM | POA: Insufficient documentation

## 2017-01-14 ENCOUNTER — Other Ambulatory Visit: Payer: Self-pay | Admitting: Physician Assistant

## 2017-01-14 DIAGNOSIS — N63 Unspecified lump in unspecified breast: Secondary | ICD-10-CM

## 2017-01-22 ENCOUNTER — Other Ambulatory Visit: Payer: Self-pay | Admitting: Physician Assistant

## 2017-01-22 ENCOUNTER — Ambulatory Visit
Admission: RE | Admit: 2017-01-22 | Discharge: 2017-01-22 | Disposition: A | Discharge status: Home / Self Care | Payer: BLUE CROSS/BLUE SHIELD | Source: Ambulatory Visit | Attending: Physician Assistant | Admitting: Physician Assistant

## 2017-01-22 DIAGNOSIS — N63 Unspecified lump in unspecified breast: Secondary | ICD-10-CM | POA: Diagnosis present

## 2017-01-22 DIAGNOSIS — N6001 Solitary cyst of right breast: Secondary | ICD-10-CM | POA: Diagnosis not present

## 2017-01-22 HISTORY — DX: Unspecified lump in unspecified breast: N63.0

## 2017-09-10 ENCOUNTER — Other Ambulatory Visit: Payer: Self-pay | Admitting: Physician Assistant

## 2017-09-10 DIAGNOSIS — R519 Headache, unspecified: Secondary | ICD-10-CM

## 2017-09-10 DIAGNOSIS — R51 Headache: Secondary | ICD-10-CM

## 2017-09-10 DIAGNOSIS — R55 Syncope and collapse: Secondary | ICD-10-CM

## 2017-09-17 ENCOUNTER — Ambulatory Visit: Payer: BLUE CROSS/BLUE SHIELD

## 2017-09-19 ENCOUNTER — Other Ambulatory Visit: Payer: Self-pay | Admitting: Physician Assistant

## 2017-09-19 DIAGNOSIS — R519 Headache, unspecified: Secondary | ICD-10-CM

## 2017-09-19 DIAGNOSIS — R55 Syncope and collapse: Secondary | ICD-10-CM

## 2017-09-19 DIAGNOSIS — R51 Headache: Secondary | ICD-10-CM

## 2017-12-29 ENCOUNTER — Other Ambulatory Visit: Payer: Self-pay | Admitting: Physician Assistant

## 2017-12-29 DIAGNOSIS — Z1231 Encounter for screening mammogram for malignant neoplasm of breast: Secondary | ICD-10-CM

## 2018-01-15 ENCOUNTER — Ambulatory Visit
Admission: RE | Admit: 2018-01-15 | Discharge: 2018-01-15 | Disposition: A | Discharge status: Home / Self Care | Payer: BLUE CROSS/BLUE SHIELD | Source: Ambulatory Visit | Attending: Physician Assistant | Admitting: Physician Assistant

## 2018-01-15 DIAGNOSIS — Z1231 Encounter for screening mammogram for malignant neoplasm of breast: Secondary | ICD-10-CM

## 2018-01-16 ENCOUNTER — Other Ambulatory Visit: Payer: Self-pay | Admitting: Physician Assistant

## 2018-01-16 DIAGNOSIS — R3129 Other microscopic hematuria: Secondary | ICD-10-CM

## 2019-01-20 ENCOUNTER — Other Ambulatory Visit: Payer: Self-pay | Admitting: Physician Assistant

## 2019-01-20 DIAGNOSIS — Z1231 Encounter for screening mammogram for malignant neoplasm of breast: Secondary | ICD-10-CM

## 2019-02-24 ENCOUNTER — Encounter (INDEPENDENT_AMBULATORY_CARE_PROVIDER_SITE_OTHER): Payer: Self-pay

## 2019-02-24 ENCOUNTER — Ambulatory Visit
Admission: RE | Admit: 2019-02-24 | Discharge: 2019-02-24 | Disposition: A | Discharge status: Home / Self Care | Payer: BC Managed Care – PPO | Source: Ambulatory Visit | Attending: Physician Assistant | Admitting: Physician Assistant

## 2019-02-24 ENCOUNTER — Other Ambulatory Visit: Payer: Self-pay

## 2019-02-24 DIAGNOSIS — Z1231 Encounter for screening mammogram for malignant neoplasm of breast: Secondary | ICD-10-CM | POA: Diagnosis present

## 2020-03-10 ENCOUNTER — Other Ambulatory Visit: Payer: Self-pay | Admitting: Physician Assistant

## 2020-08-02 ENCOUNTER — Other Ambulatory Visit: Payer: Self-pay | Admitting: Physician Assistant

## 2020-08-02 DIAGNOSIS — Z1231 Encounter for screening mammogram for malignant neoplasm of breast: Secondary | ICD-10-CM

## 2020-08-10 ENCOUNTER — Ambulatory Visit: Payer: BC Managed Care – PPO

## 2020-08-15 ENCOUNTER — Ambulatory Visit
Admission: RE | Admit: 2020-08-15 | Discharge: 2020-08-15 | Disposition: A | Discharge status: Home / Self Care | Payer: BC Managed Care – PPO | Source: Ambulatory Visit | Attending: Physician Assistant | Admitting: Physician Assistant

## 2020-08-15 ENCOUNTER — Other Ambulatory Visit: Payer: Self-pay

## 2020-08-15 DIAGNOSIS — Z1231 Encounter for screening mammogram for malignant neoplasm of breast: Secondary | ICD-10-CM | POA: Insufficient documentation

## 2021-07-12 ENCOUNTER — Other Ambulatory Visit: Payer: Self-pay | Admitting: Physician Assistant

## 2021-07-12 DIAGNOSIS — Z1231 Encounter for screening mammogram for malignant neoplasm of breast: Secondary | ICD-10-CM

## 2021-11-20 ENCOUNTER — Ambulatory Visit
Admission: RE | Admit: 2021-11-20 | Discharge: 2021-11-20 | Disposition: A | Discharge status: Home / Self Care | Payer: BC Managed Care – PPO | Source: Ambulatory Visit | Attending: Physician Assistant | Admitting: Physician Assistant

## 2021-11-20 DIAGNOSIS — Z1231 Encounter for screening mammogram for malignant neoplasm of breast: Secondary | ICD-10-CM | POA: Insufficient documentation

## 2023-03-04 ENCOUNTER — Encounter: Payer: Self-pay | Admitting: Emergency Medicine

## 2023-03-04 ENCOUNTER — Ambulatory Visit
Admission: EM | Admit: 2023-03-04 | Discharge: 2023-03-04 | Disposition: A | Discharge status: Home / Self Care | Payer: BC Managed Care – PPO | Attending: Physician Assistant | Admitting: Physician Assistant

## 2023-03-04 DIAGNOSIS — J029 Acute pharyngitis, unspecified: Secondary | ICD-10-CM | POA: Insufficient documentation

## 2023-03-04 DIAGNOSIS — R0981 Nasal congestion: Secondary | ICD-10-CM | POA: Diagnosis not present

## 2023-03-04 DIAGNOSIS — Z1152 Encounter for screening for COVID-19: Secondary | ICD-10-CM | POA: Diagnosis not present

## 2023-03-04 DIAGNOSIS — J069 Acute upper respiratory infection, unspecified: Secondary | ICD-10-CM | POA: Insufficient documentation

## 2023-03-04 LAB — SARS CORONAVIRUS 2 BY RT PCR: SARS Coronavirus 2 by RT PCR: NEGATIVE

## 2023-03-04 LAB — GROUP A STREP BY PCR: Group A Strep by PCR: NOT DETECTED

## 2023-03-04 MED ORDER — PROMETHAZINE-DM 6.25-15 MG/5ML PO SYRP: 5.0000 mL | ORAL_SOLUTION | Freq: Four times a day (QID) | ORAL | 0 refills | Status: AC | PRN

## 2023-03-04 MED ORDER — LIDOCAINE VISCOUS HCL 2 % MT SOLN: 15.0000 mL | OROMUCOSAL | 0 refills | Status: AC | PRN

## 2023-03-04 NOTE — ED Triage Notes (Signed)
Pt presents with sneezing, runny nose and sore throat x 3 days.

## 2023-03-04 NOTE — Discharge Instructions (Signed)
-  Negative strep and COVID.  URI/COLD SYMPTOMS: Your exam today is consistent with a viral illness. Antibiotics are not indicated at this time. Use medications as directed, including cough syrup, nasal saline, and decongestants. Your symptoms should improve over the next few days and resolve within 7-10 days. Increase rest and fluids. F/u if symptoms worsen or predominate such as sore throat, ear pain, productive cough, shortness of breath, or if you develop high fevers or worsening fatigue over the next several days.

## 2023-03-04 NOTE — ED Provider Notes (Signed)
[facility]    CSN: [id number] Arrival date & time: 03/04/23  0808      History   Chief Complaint Chief Complaint  Patient presents with   Sore Throat   Nasal Congestion    HPI Tanya Powell is a 52 y.o. female presenting for 3-day history of sore throat, sneezing, nasal congestion, runny nose and mild cough.  Denies fever, fatigue, body aches, chest pain, shortness of breath, abdominal pain, nausea/vomiting or diarrhea.  Denies known COVID exposure and is most concerned about strep.  She has not been taking any OTC meds.  o other complaints.  HPI  Past Medical History:  Diagnosis Date   Anxiety    Cancer (HCC)    skin   Chronic kidney disease    stones   Hypertension    PONV (postoperative nausea and vomiting)     There are no problems to display for this patient.   Past Surgical History:  Procedure Laterality Date   AUGMENTATION MAMMAPLASTY Bilateral 2000   BREAST CYST ASPIRATION     neg, ? side   CATARACT EXTRACTION W/PHACO Left 11/14/2014   Procedure: CATARACT EXTRACTION PHACO AND INTRAOCULAR LENS PLACEMENT (IOC);  Surgeon: Sallee Lange, MD;  Location: [facility] ORS;  Service: Ophthalmology;  Laterality: Left;  [place] 00:35 AP% 18.3 CDE 8.72   CESAREAN SECTION     PLACEMENT OF BREAST IMPLANTS     right foot      OB History   No obstetric history on file.      Home Medications    Prior to Admission medications   Medication Sig Start Date End Date Taking? Authorizing Provider  buPROPion (WELLBUTRIN XL) 300 MG 24 hr tablet Take 300 mg by mouth daily.   Yes [provider]  desogestrel-ethinyl estradiol (KARIVA,AZURETTE,MIRCETTE) 0.15-0.02/0.01 MG (21/5) tablet Take 1 tablet by mouth daily.   Yes [provider]  lidocaine (XYLOCAINE) 2 % solution Use as directed 15 mLs in the mouth or throat every 3 (three) hours as needed for mouth pain (swish and spit). 03/04/23  Yes Eusebio Friendly B, PA-C  losartan (COZAAR) 50 MG tablet  Take 50 mg by mouth daily.   Yes [provider]  promethazine-dextromethorphan (PROMETHAZINE-DM) 6.25-15 MG/5ML syrup Take 5 mLs by mouth 4 (four) times daily as needed. 03/04/23  Yes Eusebio Friendly B, PA-C  escitalopram (LEXAPRO) 10 MG tablet Take 10 mg by mouth daily.    [provider]  pantoprazole (PROTONIX) 40 MG tablet Take 40 mg by mouth daily.    [provider]  valACYclovir (VALTREX) 500 MG tablet Take 500 mg by mouth 2 (two) times daily. As needed    [provider]    Family History Family History  Problem Relation Age of Onset   Breast cancer Cousin     Social History Social History   Tobacco Use   Smoking status: Never   Smokeless tobacco: Never  Vaping Use   Vaping status: Never Used  Substance Use Topics   Alcohol use: Yes    Comment: occasional   Drug use: Never     Allergies   Sulfa antibiotics   Review of Systems Review of Systems  Constitutional:  Negative for chills, diaphoresis, fatigue and fever.  HENT:  Positive for congestion, rhinorrhea, sneezing and sore throat. Negative for ear pain, sinus pressure and sinus pain.   Respiratory:  Positive for cough. Negative for shortness of breath.   Gastrointestinal:  Negative for abdominal pain, nausea and vomiting.  Musculoskeletal:  Negative for arthralgias and myalgias.  Skin:  Negative for rash.  Neurological:  Negative for weakness and headaches.  Hematological:  Negative for adenopathy.     Physical Exam Triage Vital Signs ED Triage Vitals  Encounter Vitals Group     BP      Systolic BP Percentile      Diastolic BP Percentile      Pulse      Resp      Temp      Temp src      SpO2      Weight      Height      Head Circumference      Peak Flow      Pain Score      Pain Loc      Pain Education      Exclude from Growth Chart    No data found.  Updated Vital Signs BP (!) 146/86 (BP Location: Right Arm)   Pulse 88   Temp 99.1 F (37.3 C)   Resp  16   SpO2 96%     Physical Exam Vitals and nursing note reviewed.  Constitutional:      General: She is not in acute distress.    Appearance: Normal appearance. She is not ill-appearing or toxic-appearing.  HENT:     Head: Normocephalic and atraumatic.     Nose: Congestion present.     Mouth/Throat:     Mouth: Mucous membranes are moist.     Pharynx: Oropharynx is clear. Posterior oropharyngeal erythema present.  Eyes:     General: No scleral icterus.       Right eye: No discharge.        Left eye: No discharge.     Conjunctiva/sclera: Conjunctivae normal.  Cardiovascular:     Rate and Rhythm: Normal rate and regular rhythm.     Heart sounds: Normal heart sounds.  Pulmonary:     Effort: Pulmonary effort is normal. No respiratory distress.     Breath sounds: Normal breath sounds.  Musculoskeletal:     Cervical back: Neck supple.  Skin:    General: Skin is dry.  Neurological:     General: No focal deficit present.     Mental Status: She is alert. Mental status is at baseline.     Motor: No weakness.     Gait: Gait normal.  Psychiatric:        Mood and Affect: Mood normal.        Behavior: Behavior normal.        Thought Content: Thought content normal.      UC Treatments / Results  Labs (all labs ordered are listed, but only abnormal results are displayed) Labs Reviewed  GROUP A STREP BY PCR  SARS CORONAVIRUS 2 BY RT PCR    EKG   Radiology No results found.  Procedures Procedures (including critical care time)  Medications Ordered in UC Medications - No data to display  Initial Impression / Assessment and Plan / UC Course  I have reviewed the triage vital signs and the nursing notes.  Pertinent labs & imaging results that were available during my care of the patient were reviewed by me and considered in my medical decision making (see chart for details).   52 year old female presents for 3-day history of sore throat, runny nose and mild cough.     Vitals are normal and stable and she is overall well-appearing.  Mild posterior pharyngeal erythema and nasal  congestion.  Chest clear.  COVID and strep testing obtained. Negative. Reviewed all results with patient.   Viral URI. Supportive care advised. Sent promethazine DM and viscous lidocaine. Reviewed return precautions.   Final Clinical Impressions(s) / UC Diagnoses   Final diagnoses:  Viral upper respiratory tract infection  Sore throat  Nasal congestion     Discharge Instructions      -Negative strep and COVID  URI/COLD SYMPTOMS: Your exam today is consistent with a viral illness. Antibiotics are not indicated at this time. Use medications as directed, including cough syrup, nasal saline, and decongestants. Your symptoms should improve over the next few days and resolve within 7-10 days. Increase rest and fluids. F/u if symptoms worsen or predominate such as sore throat, ear pain, productive cough, shortness of breath, or if you develop high fevers or worsening fatigue over the next several days.       ED Prescriptions     Medication Sig Dispense Auth. Provider   promethazine-dextromethorphan (PROMETHAZINE-DM) 6.25-15 MG/5ML syrup Take 5 mLs by mouth 4 (four) times daily as needed. 118 mL Eusebio Friendly B, PA-C   lidocaine (XYLOCAINE) 2 % solution Use as directed 15 mLs in the mouth or throat every 3 (three) hours as needed for mouth pain (swish and spit). 100 mL Shirlee Latch, PA-C      PDMP not reviewed this encounter.   Shirlee Latch, PA-C 03/04/23 [phone]

## 2023-07-22 ENCOUNTER — Other Ambulatory Visit: Payer: Self-pay | Admitting: Physician Assistant

## 2023-07-22 DIAGNOSIS — Z1231 Encounter for screening mammogram for malignant neoplasm of breast: Secondary | ICD-10-CM

## 2023-07-30 ENCOUNTER — Ambulatory Visit
Admission: RE | Admit: 2023-07-30 | Discharge: 2023-07-30 | Disposition: A | Discharge status: Home / Self Care | Payer: BC Managed Care – PPO | Source: Ambulatory Visit | Attending: Physician Assistant | Admitting: Physician Assistant

## 2023-07-30 DIAGNOSIS — Z1231 Encounter for screening mammogram for malignant neoplasm of breast: Secondary | ICD-10-CM | POA: Insufficient documentation

## 2023-10-08 ENCOUNTER — Ambulatory Visit
Admission: RE | Admit: 2023-10-08 | Discharge: 2023-10-08 | Disposition: A | Discharge status: Home / Self Care | Source: Ambulatory Visit | Attending: Physician Assistant | Admitting: Physician Assistant

## 2023-10-08 ENCOUNTER — Other Ambulatory Visit: Payer: Self-pay | Admitting: Physician Assistant

## 2023-10-08 DIAGNOSIS — K219 Gastro-esophageal reflux disease without esophagitis: Secondary | ICD-10-CM | POA: Diagnosis present

## 2023-10-08 DIAGNOSIS — R1011 Right upper quadrant pain: Secondary | ICD-10-CM
